# Patient Record
Sex: Male | Born: 1995 | Race: Black or African American | Hispanic: No | Marital: Single | Smoking: Current every day smoker
Health system: Southern US, Community
[De-identification: ages and names within clinical notes are randomized; demographics above are authoritative.]

## PROBLEM LIST (undated history)

## (undated) DIAGNOSIS — F909 Attention-deficit hyperactivity disorder, unspecified type: Secondary | ICD-10-CM

---
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
## 2016-04-19 ENCOUNTER — Emergency Department: Payer: No Typology Code available for payment source

## 2016-04-19 ENCOUNTER — Emergency Department
Admission: EM | Admit: 2016-04-19 | Discharge: 2016-04-19 | Disposition: A | Discharge status: Home / Self Care | Payer: No Typology Code available for payment source | Attending: Emergency Medicine | Admitting: Emergency Medicine

## 2016-04-19 ENCOUNTER — Encounter: Payer: Self-pay | Admitting: Emergency Medicine

## 2016-04-19 DIAGNOSIS — F909 Attention-deficit hyperactivity disorder, unspecified type: Secondary | ICD-10-CM | POA: Diagnosis not present

## 2016-04-19 DIAGNOSIS — Z0289 Encounter for other administrative examinations: Secondary | ICD-10-CM | POA: Diagnosis not present

## 2016-04-19 DIAGNOSIS — S0081XA Abrasion of other part of head, initial encounter: Secondary | ICD-10-CM | POA: Insufficient documentation

## 2016-04-19 DIAGNOSIS — S20219A Contusion of unspecified front wall of thorax, initial encounter: Secondary | ICD-10-CM | POA: Insufficient documentation

## 2016-04-19 DIAGNOSIS — Y9389 Activity, other specified: Secondary | ICD-10-CM | POA: Insufficient documentation

## 2016-04-19 DIAGNOSIS — Z008 Encounter for other general examination: Secondary | ICD-10-CM

## 2016-04-19 DIAGNOSIS — F1290 Cannabis use, unspecified, uncomplicated: Secondary | ICD-10-CM | POA: Diagnosis not present

## 2016-04-19 DIAGNOSIS — Y92410 Unspecified street and highway as the place of occurrence of the external cause: Secondary | ICD-10-CM | POA: Insufficient documentation

## 2016-04-19 DIAGNOSIS — Y999 Unspecified external cause status: Secondary | ICD-10-CM | POA: Insufficient documentation

## 2016-04-19 DIAGNOSIS — F17200 Nicotine dependence, unspecified, uncomplicated: Secondary | ICD-10-CM | POA: Insufficient documentation

## 2016-04-19 DIAGNOSIS — S0990XA Unspecified injury of head, initial encounter: Secondary | ICD-10-CM | POA: Diagnosis present

## 2016-04-19 HISTORY — DX: Attention-deficit hyperactivity disorder, unspecified type: F90.9

## 2016-04-19 LAB — CBC
HCT: 47.5 % (ref 40.0–52.0)
Hemoglobin: 15.5 g/dL (ref 13.0–18.0)
MCH: 27.3 pg (ref 26.0–34.0)
MCHC: 32.8 g/dL (ref 32.0–36.0)
MCV: 83.3 fL (ref 80.0–100.0)
PLATELETS: 194 10*3/uL (ref 150–440)
RBC: 5.7 MIL/uL (ref 4.40–5.90)
RDW: 13.8 % (ref 11.5–14.5)
WBC: 7.4 10*3/uL (ref 3.8–10.6)

## 2016-04-19 LAB — URINE DRUG SCREEN, QUALITATIVE (ARMC ONLY)
Amphetamines, Ur Screen: NOT DETECTED
BARBITURATES, UR SCREEN: NOT DETECTED
Benzodiazepine, Ur Scrn: POSITIVE — AB
CANNABINOID 50 NG, UR ~~LOC~~: POSITIVE — AB
COCAINE METABOLITE, UR ~~LOC~~: NOT DETECTED
MDMA (Ecstasy)Ur Screen: NOT DETECTED
Methadone Scn, Ur: NOT DETECTED
OPIATE, UR SCREEN: NOT DETECTED
PHENCYCLIDINE (PCP) UR S: NOT DETECTED
Tricyclic, Ur Screen: NOT DETECTED

## 2016-04-19 LAB — ETHANOL: Alcohol, Ethyl (B): <5 mg/dL (ref <5)
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
## 2016-04-19 NOTE — Discharge Instructions (Signed)
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
## 2016-04-19 NOTE — ED Notes (Signed)
Discharge instructions reviewed with patient. Questions fielded by this RN. Patient verbalizes understanding of instructions. Patient discharged to police custody in stable condition. No acute distress noted at time of discharge.  Pt refused to let me check vs and sign discharge papers.
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
